# Patient Record
Sex: Female | Born: 1990 | Race: Black or African American | Hispanic: No | Marital: Single | Smoking: Current every day smoker
Health system: Southern US, Community
[De-identification: ages and names within clinical notes are randomized; demographics above are authoritative.]

---

## 2019-05-13 ENCOUNTER — Ambulatory Visit (LOCAL_COMMUNITY_HEALTH_CENTER): Payer: Medicaid Other

## 2019-05-13 ENCOUNTER — Other Ambulatory Visit: Payer: Self-pay

## 2019-05-13 VITALS — BP 112/77 | Ht 64.0 in | Wt 202.5 lb

## 2019-05-13 DIAGNOSIS — Z3202 Encounter for pregnancy test, result negative: Secondary | ICD-10-CM | POA: Diagnosis not present

## 2019-05-13 LAB — PREGNANCY, URINE: Preg Test, Ur: NEGATIVE

## 2019-05-13 MED ORDER — MULTI-VITAMIN/MINERALS PO TABS: 1.0000 | ORAL_TABLET | Freq: Every day | ORAL | 0 refills | Status: AC

## 2019-05-13 NOTE — Progress Notes (Signed)
Recently moved to [facility] from [place]. Does not yet have PCP or pharmacy chosen. Currently taking no medicines. Folic acid counseling completed and MVI dispensed. Rich Number, RN

## 2019-09-25 ENCOUNTER — Other Ambulatory Visit: Payer: Self-pay

## 2019-09-25 ENCOUNTER — Emergency Department: Payer: Medicaid Other

## 2019-09-25 ENCOUNTER — Encounter: Payer: Self-pay | Admitting: Emergency Medicine

## 2019-09-25 ENCOUNTER — Emergency Department
Admission: EM | Admit: 2019-09-25 | Discharge: 2019-09-25 | Disposition: A | Discharge status: Home / Self Care | Payer: Medicaid Other | Attending: Emergency Medicine | Admitting: Emergency Medicine

## 2019-09-25 DIAGNOSIS — M545 Low back pain, unspecified: Secondary | ICD-10-CM

## 2019-09-25 DIAGNOSIS — M62830 Muscle spasm of back: Secondary | ICD-10-CM | POA: Insufficient documentation

## 2019-09-25 DIAGNOSIS — Z79899 Other long term (current) drug therapy: Secondary | ICD-10-CM | POA: Diagnosis not present

## 2019-09-25 LAB — URINALYSIS, COMPLETE (UACMP) WITH MICROSCOPIC
Bilirubin Urine: NEGATIVE
Glucose, UA: NEGATIVE mg/dL
Hgb urine dipstick: NEGATIVE
Ketones, ur: NEGATIVE mg/dL
Leukocytes,Ua: NEGATIVE
Nitrite: NEGATIVE
Protein, ur: NEGATIVE mg/dL
Specific Gravity, Urine: 1.027 (ref 1.005–1.030)
pH: 5 (ref 5.0–8.0)

## 2019-09-25 LAB — POCT PREGNANCY, URINE: Preg Test, Ur: NEGATIVE

## 2019-09-25 MED ORDER — CYCLOBENZAPRINE HCL 5 MG PO TABS: 5.0000 mg | ORAL_TABLET | Freq: Three times a day (TID) | ORAL | 0 refills | Status: DC | PRN

## 2019-09-25 MED ORDER — NAPROXEN 500 MG PO TABS
500.0000 mg | ORAL_TABLET | Freq: Once | ORAL | Status: AC
  Administered 2019-09-25: 13:00:00 500 mg via ORAL
  Filled 2019-09-25: qty 1

## 2019-09-25 MED ORDER — KETOROLAC TROMETHAMINE 10 MG PO TABS: 10.0000 mg | ORAL_TABLET | Freq: Once | ORAL | Status: DC

## 2019-09-25 MED ORDER — NAPROXEN 500 MG PO TABS: 500.0000 mg | ORAL_TABLET | Freq: Two times a day (BID) | ORAL | 0 refills | Status: AC

## 2019-09-25 MED ORDER — KETOROLAC TROMETHAMINE 30 MG/ML IJ SOLN
30.0000 mg | Freq: Once | INTRAMUSCULAR | Status: DC
  Filled 2019-09-25: qty 1

## 2019-09-25 NOTE — Discharge Instructions (Signed)
Your exam and XR are normal following your injury. Take the prescription anti-inflammatory and muscle relaxant as needed. Apply ice and/or moist heat to reduce symptoms. Follow-up with [facility] or return to the ED as needed.

## 2019-09-25 NOTE — ED Provider Notes (Signed)
[facility] Emergency Department Provider Note ____________________________________________  Time seen: 1112  I have reviewed the triage vital signs and the nursing notes.  HISTORY  Chief Complaint  Back Pain  HPI Emily Mcmillan is a 29 y.o. female presents herself to the ED for evaluation of a 2 to 3-day complaint of low back pain.   Patient reports she had to climb into her apartment to retreive the car keys. She then climbed over the balcony, and jumped to the ground from a hanging position. She admits to initially landing on her feet, but rolled onto her hands & feet. She describes a shock going through her back as she landed. She reports intermittent back spasms and pain increased with bending and prolonged sitting.  She also denies any bladder bowel incontinence, foot drop, or distal paresthesias. She reports pain is increased when she is bending over.  She has taken ibuprofen without lasting benefit. She denies any history of chronic or ongoing back pain.  History reviewed. No pertinent past medical history.  There are no problems to display for this patient.   History reviewed. No pertinent surgical history.  Prior to Admission medications   Medication Sig Start Date End Date Taking? Authorizing Provider  Multiple Vitamins-Minerals (MULTIVITAMIN WITH MINERALS) tablet Take 1 tablet by mouth daily. 05/13/19   Caren Macadam, MD    Allergies Patient has no known allergies.  History reviewed. No pertinent family history.  Social History Social History   Tobacco Use  . Smoking status: Not on file  Substance Use Topics  . Alcohol use: Not on file  . Drug use: Not on file    Review of Systems  Constitutional: Negative for fever. Cardiovascular: Negative for chest pain. Respiratory: Negative for shortness of breath. Gastrointestinal: Negative for abdominal pain, vomiting and diarrhea. Genitourinary: Negative for dysuria. Musculoskeletal:  Positive for back pain. Skin: Negative for rash. Neurological: Negative for headaches, focal weakness or numbness. ____________________________________________  PHYSICAL EXAM:  VITAL SIGNS: ED Triage Vitals  Enc Vitals Group     BP 09/25/19 1030 124/72     Pulse Rate 09/25/19 1030 83     Resp 09/25/19 1030 16     Temp 09/25/19 1030 98.6 F (37 C)     Temp Source 09/25/19 1030 Oral     SpO2 09/25/19 1030 98 %     Weight 09/25/19 1031 198 lb (89.8 kg)     Height 09/25/19 1031 5\' 3"  (1.6 m)     Head Circumference --      Peak Flow --      Pain Score 09/25/19 1031 0     Pain Loc --      Pain Edu? --      Excl. in [place]? --     Constitutional: Alert and oriented. Well appearing and in no distress. Head: Normocephalic and atraumatic. Eyes: Conjunctivae are normal. Normal extraocular movements Neck: Supple. No thyromegaly. Cardiovascular: Normal rate, regular rhythm. Normal distal pulses. Respiratory: Normal respiratory effort. No wheezes/rales/rhonchi. Gastrointestinal: Soft and nontender. No distention. Musculoskeletal: Normal spinal alignment. No midline tenderness, step-off, or deformity. Normal hip/knee flexion range bilaterally. Nontender with normal range of motion in all extremities.  Neurologic: CN II-XII grossly intact. Normal LE DTRs bilaterally.  Normal gait without ataxia. Negative seated SLR bilaterally. Normal speech and language. No gross focal neurologic deficits are appreciated. Skin:  Skin is warm, dry and intact. No rash noted. Psychiatric: Mood and affect are normal. Patient exhibits appropriate insight and judgment. ____________________________________________  LABS (pertinent positives/negatives Labs Reviewed  URINALYSIS, COMPLETE (UACMP) WITH MICROSCOPIC  POC URINE PREG, ED  ____________________________________________   RADIOLOGY  DG Lumbar Spine  Negative  I, Lamonda Noxon V Bacon-Zeda Gangwer, personally viewed and evaluated these images (plain radiographs)  as part of my medical decision making, as well as reviewing the written report by the radiologist. ____________________________________________  PROCEDURES  Naproxen 500 mg PO Procedures ____________________________________________  INITIAL IMPRESSION / ASSESSMENT AND PLAN / ED COURSE  DDX: lumbar strain, compression fracture, HNP, myalgias  Patient with ED evaluation injury to the low back.  Her exam is consistent with a lumbar strain and muscle spasm.  X-ray is negative and reassuring at this time for any acute fracture or dislocation.  Patient does have some straightening of the normal lordosis on films.  Her exam does not indicate any acute neuromuscular deficit.  She will be treated with anti-inflammatories and muscle relaxants at this time.  Return precautions have been reviewed.  Emily Mcmillan was evaluated in Emergency Department on 09/25/2019 for the symptoms described in the history of present illness. She was evaluated in the context of the global COVID-19 pandemic, which necessitated consideration that the patient might be at risk for infection with the SARS-CoV-2 virus that causes COVID-19. Institutional protocols and algorithms that pertain to the evaluation of patients at risk for COVID-19 are in a state of rapid change based on information released by regulatory bodies including the CDC and federal and state organizations. These policies and algorithms were followed during the patient's care in the ED. ____________________________________________  FINAL CLINICAL IMPRESSION(S) / ED DIAGNOSES  Final diagnoses:  Acute bilateral low back pain without sciatica  Muscle spasm of back      Lissa Hoard, PA-C 09/25/19 1409    Concha Se, MD 09/26/19 [id number]

## 2019-09-25 NOTE — ED Triage Notes (Signed)
Pt to ED via POV c/o back pain x 2 days. Pt denies numbness or weakness in her legs, Pt denies trouble holding her bowels or bladder. Pt is in NAD.

## 2019-09-25 NOTE — ED Notes (Signed)
Patient declined the IM injection and states she would prefer an oral form of Toradol.

## 2020-05-10 ENCOUNTER — Emergency Department
Admission: EM | Admit: 2020-05-10 | Discharge: 2020-05-10 | Disposition: A | Discharge status: Home / Self Care | Payer: Medicaid Other | Attending: Emergency Medicine | Admitting: Emergency Medicine

## 2020-05-10 ENCOUNTER — Encounter: Payer: Self-pay | Admitting: Emergency Medicine

## 2020-05-10 ENCOUNTER — Other Ambulatory Visit: Payer: Self-pay

## 2020-05-10 DIAGNOSIS — H65111 Acute and subacute allergic otitis media (mucoid) (sanguinous) (serous), right ear: Secondary | ICD-10-CM | POA: Diagnosis not present

## 2020-05-10 DIAGNOSIS — J069 Acute upper respiratory infection, unspecified: Secondary | ICD-10-CM | POA: Insufficient documentation

## 2020-05-10 DIAGNOSIS — F17200 Nicotine dependence, unspecified, uncomplicated: Secondary | ICD-10-CM | POA: Insufficient documentation

## 2020-05-10 DIAGNOSIS — R0981 Nasal congestion: Secondary | ICD-10-CM | POA: Diagnosis present

## 2020-05-10 MED ORDER — FLUTICASONE PROPIONATE 50 MCG/ACT NA SUSP: 1.0000 | Freq: Two times a day (BID) | NASAL | 0 refills | Status: DC

## 2020-05-10 MED ORDER — AMOXICILLIN 875 MG PO TABS: 875.0000 mg | ORAL_TABLET | Freq: Two times a day (BID) | ORAL | 0 refills | Status: DC

## 2020-05-10 MED ORDER — AMOXICILLIN 500 MG PO CAPS
1000.0000 mg | ORAL_CAPSULE | Freq: Once | ORAL | Status: AC
  Administered 2020-05-10: 1000 mg via ORAL
  Filled 2020-05-10: qty 2

## 2020-05-10 NOTE — ED Provider Notes (Signed)
[facility] Emergency Department Provider Note  ____________________________________________  Time seen: Approximately 9:34 PM  I have reviewed the triage vital signs and the nursing notes.   HISTORY  Chief Complaint No chief complaint on file.    HPI Emily Mcmillan is a 29 y.o. female who presents the emergency department complaining of upper respiratory infection symptoms of nasal congestion, headache, sore throat, cough.  Patient states that she has had a negative Covid test for the symptoms.  Patient states that today she developed sharp right ear pain and believes that she may have an ear infection.  No chest pain or shortness of breath.  No abdominal pain no nausea or vomiting.  Patient's main complaint at this time is right ear pain         History reviewed. No pertinent past medical history.  There are no problems to display for this patient.   History reviewed. No pertinent surgical history.  Prior to Admission medications   Medication Sig Start Date End Date Taking? Authorizing Provider  amoxicillin (AMOXIL) 875 MG tablet Take 1 tablet (875 mg total) by mouth 2 (two) times daily. 05/10/20   Marcelline Temkin, Delorise Royals, PA-C  cyclobenzaprine (FLEXERIL) 5 MG tablet Take 1 tablet (5 mg total) by mouth 3 (three) times daily as needed. 09/25/19   Menshew, Charlesetta Ivory, PA-C  fluticasone (FLONASE) 50 MCG/ACT nasal spray Place 1 spray into both nostrils 2 (two) times daily. 05/10/20   Berea Majkowski, Delorise Royals, PA-C  Multiple Vitamins-Minerals (MULTIVITAMIN WITH MINERALS) tablet Take 1 tablet by mouth daily. 05/13/19   Federico Flake, MD    Allergies Patient has no known allergies.  No family history on file.  Social History Social History   Tobacco Use  . Smoking status: Current Every Day Smoker  . Smokeless tobacco: Never Used  Vaping Use  . Vaping Use: Never used  Substance Use Topics  . Alcohol use: Not on file  . Drug use: Not on file      Review of Systems  Constitutional: Positive fever/chills Eyes: No visual changes. No discharge ENT: Right ear pain, nasal congestion, sore throat Cardiovascular: no chest pain. Respiratory: Positive cough. No SOB. Gastrointestinal: No abdominal pain.  No nausea, no vomiting.  No diarrhea.  No constipation. Musculoskeletal: Negative for musculoskeletal pain. Skin: Negative for rash, abrasions, lacerations, ecchymosis. Neurological: Negative for headaches, focal weakness or numbness. 10-point ROS otherwise negative.  ____________________________________________   PHYSICAL EXAM:  VITAL SIGNS: ED Triage Vitals  Enc Vitals Group     BP 05/10/20 1920 128/87     Pulse Rate 05/10/20 1920 88     Resp 05/10/20 1920 18     Temp 05/10/20 1920 98.1 F (36.7 C)     Temp Source 05/10/20 1920 Oral     SpO2 05/10/20 1920 100 %     Weight 05/10/20 1921 197 lb (89.4 kg)     Height 05/10/20 1921 5\' 4"  (1.626 m)     Head Circumference --      Peak Flow --      Pain Score 05/10/20 1920 5     Pain Loc --      Pain Edu? --      Excl. in [place]? --      Constitutional: Alert and oriented. Well appearing and in no acute distress. Eyes: Conjunctivae are normal. PERRL. EOMI. Head: Atraumatic. ENT:      Ears: EAC and TM on left side is unremarkable.  EAC on right side is  unremarkable.  TM is severely bulging but no perforation currently.  Significant mucoid fluid behind TM.      Nose: No congestion/rhinnorhea.      Mouth/Throat: Mucous membranes are moist.  Neck: No stridor.   Hematological/Lymphatic/Immunilogical: No cervical lymphadenopathy. Cardiovascular: Normal rate, regular rhythm. Normal S1 and S2.  Good peripheral circulation. Respiratory: Normal respiratory effort without tachypnea or retractions. Lungs CTAB. Good air entry to the bases with no decreased or absent breath sounds Musculoskeletal: Full range of motion to all extremities. No gross deformities appreciated. Neurologic:   Normal speech and language. No gross focal neurologic deficits are appreciated.  Skin:  Skin is warm, dry and intact. No rash noted. Psychiatric: Mood and affect are normal. Speech and behavior are normal. Patient exhibits appropriate insight and judgement.   ____________________________________________   LABS (all labs ordered are listed, but only abnormal results are displayed)  Labs Reviewed - No data to display ____________________________________________  EKG   ____________________________________________  RADIOLOGY   No results found.  ____________________________________________    PROCEDURES  Procedure(s) performed:    Procedures    Medications  amoxicillin (AMOXIL) capsule 1,000 mg (has no administration in time range)     ____________________________________________   INITIAL IMPRESSION / ASSESSMENT AND PLAN / ED COURSE  Pertinent labs & imaging results that were available during my care of the patient were reviewed by me and considered in my medical decision making (see chart for details).  Review of the [place] CSRS was performed in accordance of the [facility] prior to dispensing any controlled drugs.           Patient's diagnosis is consistent with viral URI, otitis media.  Patient presents emergency department complaining of cold like symptoms times several days.  Patient began with sharp right ear pain today.  She is already had a negative Covid test.  Patient's primary concern was right ear pain.  Visualization revealed findings consistent with otitis media.  Patient had significant bulging of the TM but there has been no spontaneous perforation.  Patient will be started on antibiotics here in the emergency department.  Amoxicillin and Flonase prescribed for the patient outpatient.  Patient has sudden popping sensation, muffled hearing, drainage of the right ear she should follow-up with ENT for likely perforation at that time..Patient is given ED  precautions to return to the ED for any worsening or new symptoms.     ____________________________________________  FINAL CLINICAL IMPRESSION(S) / ED DIAGNOSES  Final diagnoses:  Acute mucoid otitis media of right ear  Viral URI      NEW MEDICATIONS STARTED DURING THIS VISIT:  ED Discharge Orders         Ordered    amoxicillin (AMOXIL) 875 MG tablet  2 times daily        05/10/20 2143    fluticasone (FLONASE) 50 MCG/ACT nasal spray  2 times daily        05/10/20 2143              This chart was dictated using voice recognition software/Dragon. Despite best efforts to proofread, errors can occur which can change the meaning. Any change was purely unintentional.    Lanette Hampshire 05/10/20 2143    Sharman Cheek, MD 05/10/20 2214

## 2020-05-10 NOTE — ED Triage Notes (Signed)
Patient ambulatory to triage with steady gait, without difficulty or distress noted; pt reports since Monday having congestion, cough, sore throat & rt earache

## 2021-01-24 ENCOUNTER — Encounter: Payer: Self-pay | Admitting: *Deleted

## 2021-01-24 ENCOUNTER — Emergency Department
Admission: EM | Admit: 2021-01-24 | Discharge: 2021-01-24 | Disposition: A | Discharge status: Home / Self Care | Payer: Medicaid Other | Attending: Emergency Medicine | Admitting: Emergency Medicine

## 2021-01-24 ENCOUNTER — Other Ambulatory Visit: Payer: Self-pay

## 2021-01-24 DIAGNOSIS — H9203 Otalgia, bilateral: Secondary | ICD-10-CM | POA: Diagnosis present

## 2021-01-24 DIAGNOSIS — H6983 Other specified disorders of Eustachian tube, bilateral: Secondary | ICD-10-CM

## 2021-01-24 DIAGNOSIS — H6993 Unspecified Eustachian tube disorder, bilateral: Secondary | ICD-10-CM | POA: Insufficient documentation

## 2021-01-24 DIAGNOSIS — F17210 Nicotine dependence, cigarettes, uncomplicated: Secondary | ICD-10-CM | POA: Diagnosis not present

## 2021-01-24 MED ORDER — FLUTICASONE PROPIONATE 50 MCG/ACT NA SUSP: 1.0000 | Freq: Two times a day (BID) | NASAL | 1 refills | Status: AC

## 2021-01-24 MED ORDER — CETIRIZINE HCL 10 MG PO TABS: 10.0000 mg | ORAL_TABLET | Freq: Every day | ORAL | 1 refills | Status: AC

## 2021-01-24 NOTE — ED Provider Notes (Signed)
[facility] Emergency Department Provider Note  ____________________________________________  Time seen: Approximately 8:07 PM  I have reviewed the triage vital signs and the nursing notes.   HISTORY  Chief Complaint Otalgia    HPI Emily Mcmillan is a 30 y.o. female who presents the emergency department bilateral ear fullness.  She states that she is just getting over a round of COVID, has had some nasal congestion and now ear pressure.  She states that she had had this once before and was diagnosed with eustachian tube dysfunction.  The Flonase and Zyrtec at that time had improved her symptoms.  She denies any pain or fever at this time and feels like she likely does not have an ear infection but wants to be evaluated for same.  No other concerning symptoms at this time.  No medications for this complaint prior to arrival.       No past medical history on file.  There are no problems to display for this patient.   No past surgical history on file.  Prior to Admission medications   Medication Sig Start Date End Date Taking? Authorizing Provider  cetirizine (ZYRTEC) 10 MG tablet Take 1 tablet (10 mg total) by mouth daily. 01/24/21  Yes Gracey Tolle, Delorise Royals, PA-C  fluticasone (FLONASE) 50 MCG/ACT nasal spray Place 1 spray into both nostrils 2 (two) times daily. 01/24/21  Yes Sharon Stapel, Delorise Royals, PA-C  Multiple Vitamins-Minerals (MULTIVITAMIN WITH MINERALS) tablet Take 1 tablet by mouth daily. 05/13/19   Federico Flake, MD    Allergies Patient has no known allergies.  No family history on file.  Social History Social History   Tobacco Use   Smoking status: Every Day    Pack years: 0.00   Smokeless tobacco: Never  Vaping Use   Vaping Use: Never used  Substance Use Topics   Alcohol use: Yes     Review of Systems  Constitutional: No fever/chills Eyes: No visual changes. No discharge ENT: Bilateral ear fullness Cardiovascular: no  chest pain. Respiratory: no cough. No SOB. Gastrointestinal: No abdominal pain.  No nausea, no vomiting.  No diarrhea.  No constipation. Musculoskeletal: Negative for musculoskeletal pain. Skin: Negative for rash, abrasions, lacerations, ecchymosis. Neurological: Negative for headaches, focal weakness or numbness.  10 System ROS otherwise negative.  ____________________________________________   PHYSICAL EXAM:  VITAL SIGNS: ED Triage Vitals  Enc Vitals Group     BP 01/24/21 1646 118/60     Pulse Rate 01/24/21 1646 77     Resp 01/24/21 1646 18     Temp 01/24/21 1646 98 F (36.7 C)     Temp Source 01/24/21 1646 Oral     SpO2 01/24/21 1646 98 %     Weight 01/24/21 1643 195 lb (88.5 kg)     Height 01/24/21 1643 5\' 4"  (1.626 m)     Head Circumference --      Peak Flow --      Pain Score 01/24/21 1643 0     Pain Loc --      Pain Edu? --      Excl. in [place]? --      Constitutional: Alert and oriented. Well appearing and in no acute distress. Eyes: Conjunctivae are normal. PERRL. EOMI. Head: Atraumatic. ENT:      Ears: EACs unremarkable bilaterally.  TMs are bulging bilaterally with no injection or air-fluid level.      Nose: No congestion/rhinnorhea.      Mouth/Throat: Mucous membranes are moist.  Neck: No  stridor.  Cardiovascular: Normal rate, regular rhythm. Normal S1 and S2.  Good peripheral circulation. Respiratory: Normal respiratory effort without tachypnea or retractions. Lungs CTAB. Good air entry to the bases with no decreased or absent breath sounds.  Some delayed Musculoskeletal: Full range of motion to all extremities. No gross deformities appreciated. Neurologic:  Normal speech and language. No gross focal neurologic deficits are appreciated.  Skin:  Skin is warm, dry and intact. No rash noted. Psychiatric: Mood and affect are normal. Speech and behavior are normal. Patient exhibits appropriate insight and  judgement.   ____________________________________________   LABS (all labs ordered are listed, but only abnormal results are displayed)  Labs Reviewed - No data to display ____________________________________________  EKG   ____________________________________________  RADIOLOGY   No results found.  ____________________________________________    PROCEDURES  Procedure(s) performed:    Procedures    Medications - No data to display   ____________________________________________   INITIAL IMPRESSION / ASSESSMENT AND PLAN / ED COURSE  Pertinent labs & imaging results that were available during my care of the patient were reviewed by me and considered in my medical decision making (see chart for details).  Review of the  CSRS was performed in accordance of the [facility] prior to dispensing any controlled drugs.           Patient's diagnosis is consistent with eustachian tube dysfunction bilaterally.  Patient presented to the emergency department with bilateral ear fullness.  She has a history eustachian tube dysfunction and has had recent COVID with nasal congestion.  Findings are consistent with eustachian tube dysfunction with no evidence of otitis media or otitis externa.  Patient will be placed on Flonase and Zyrtec.  Follow-up primary care as needed..  Patient is given ED precautions to return to the ED for any worsening or new symptoms.     ____________________________________________  FINAL CLINICAL IMPRESSION(S) / ED DIAGNOSES  Final diagnoses:  Eustachian tube dysfunction, bilateral      NEW MEDICATIONS STARTED DURING THIS VISIT:  ED Discharge Orders          Ordered    fluticasone (FLONASE) 50 MCG/ACT nasal spray  2 times daily        01/24/21 2008    cetirizine (ZYRTEC) 10 MG tablet  Daily        01/24/21 2009                This chart was dictated using voice recognition software/Dragon. Despite best efforts to proofread,  errors can occur which can change the meaning. Any change was purely unintentional.    Lanette Hampshire 01/24/21 2034    Phineas Semen, MD 01/24/21 2125

## 2021-01-24 NOTE — ED Notes (Signed)
See triage note  Presents with bilateral ear pain for couple of days   No fever

## 2021-01-24 NOTE — ED Triage Notes (Signed)
Pt has bil ear pain  sx for 2 weeks.  Taking tylenol without relief.  Pt alert.

## 2021-04-21 IMAGING — CR DG LUMBAR SPINE COMPLETE 4+V
5 series · 5 of 5 positions shown · non-contrast
Comparison: None.

CLINICAL DATA: Acute back pain status post jumping from balcony.

EXAM:
LUMBAR SPINE - COMPLETE 4+ VIEW

[l-spine ap]
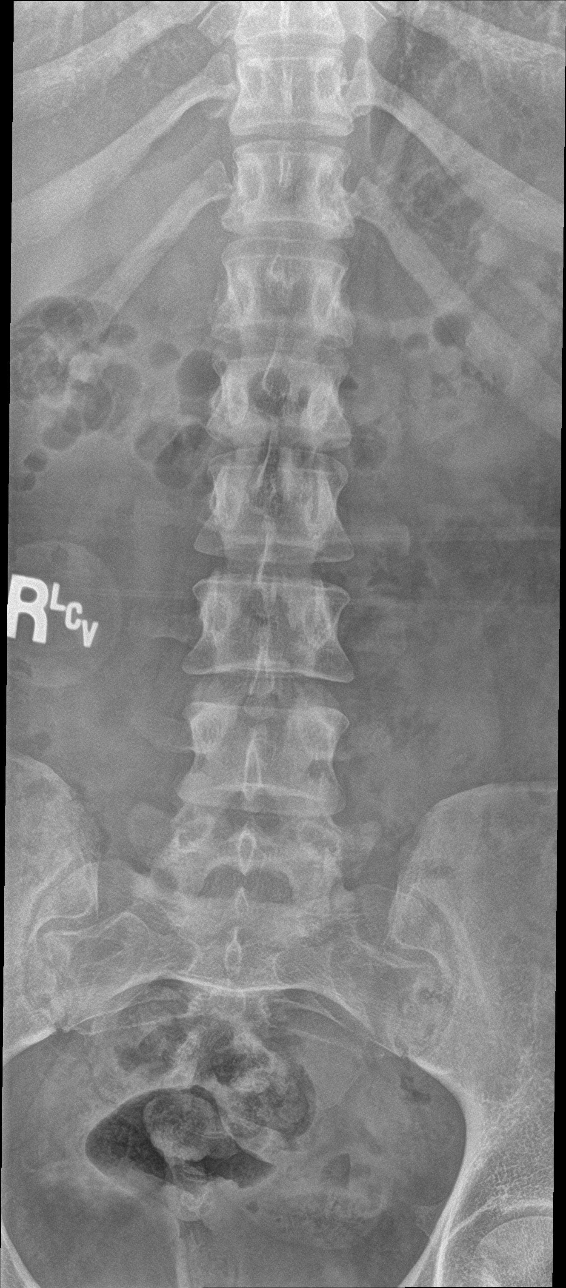

[l-spine obl (1 of 2)]
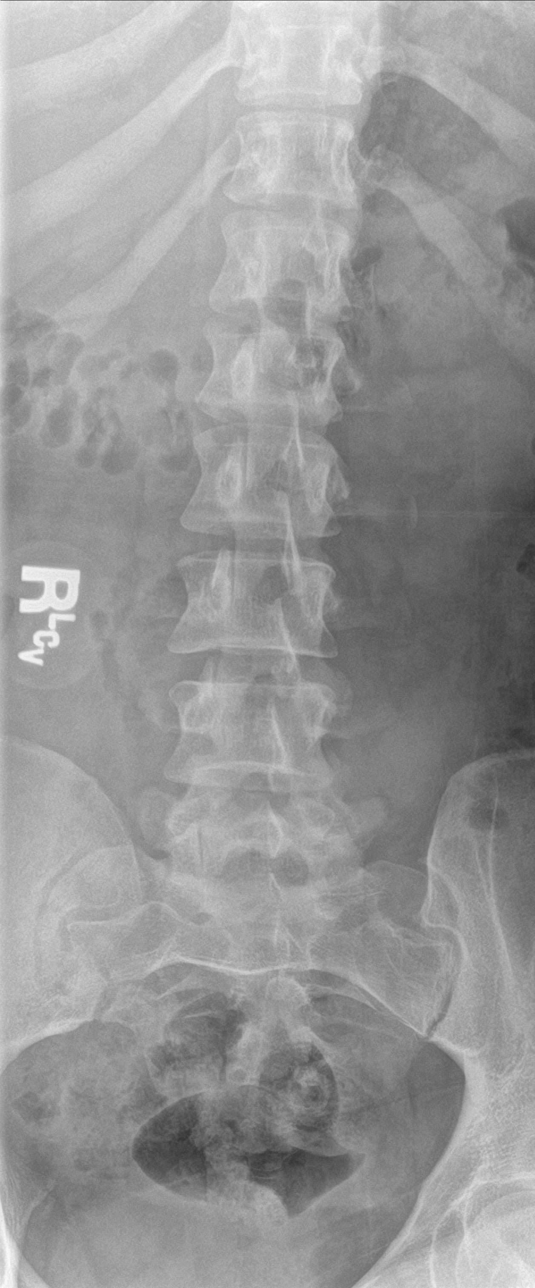

[l-spine obl (2 of 2)]
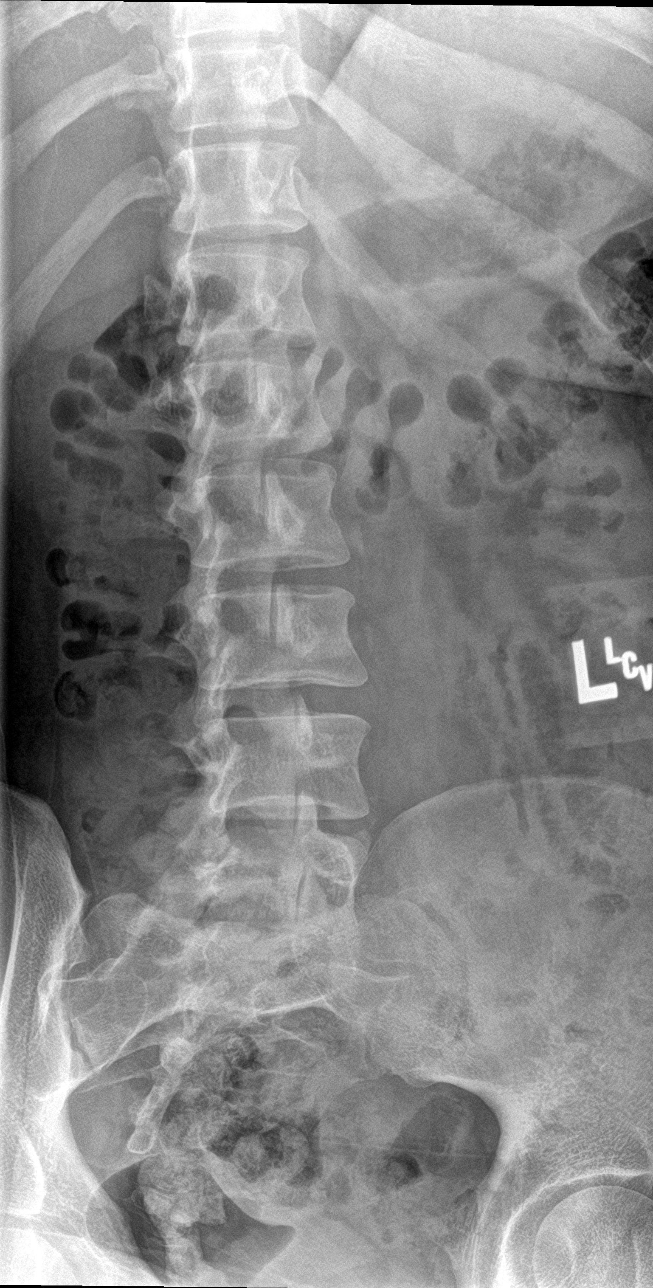

[l-spine lat]
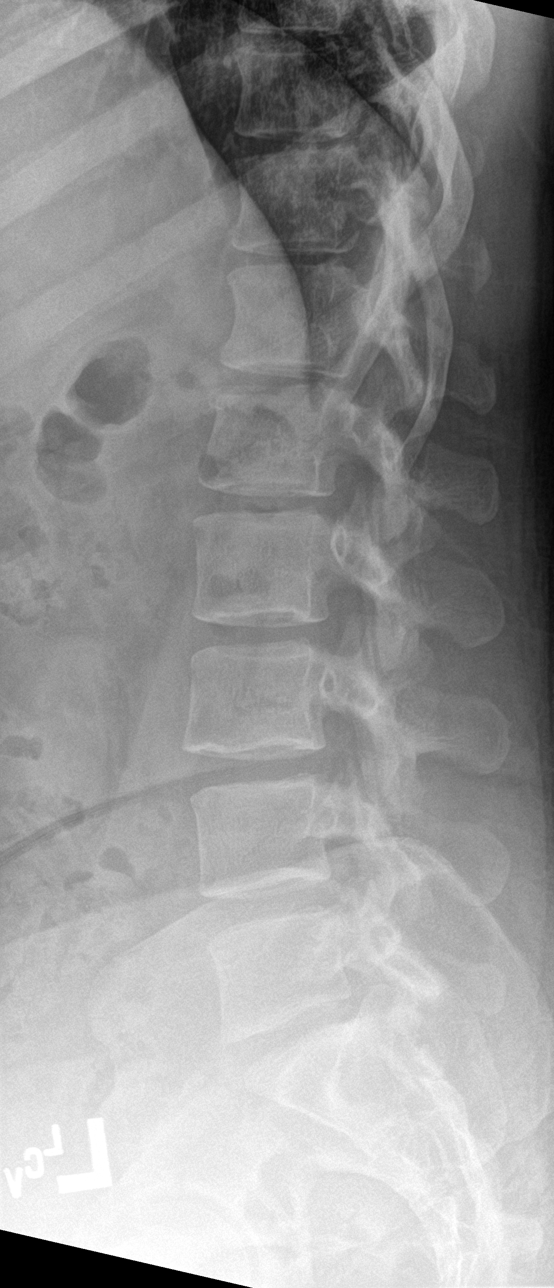

[l-spine spot]
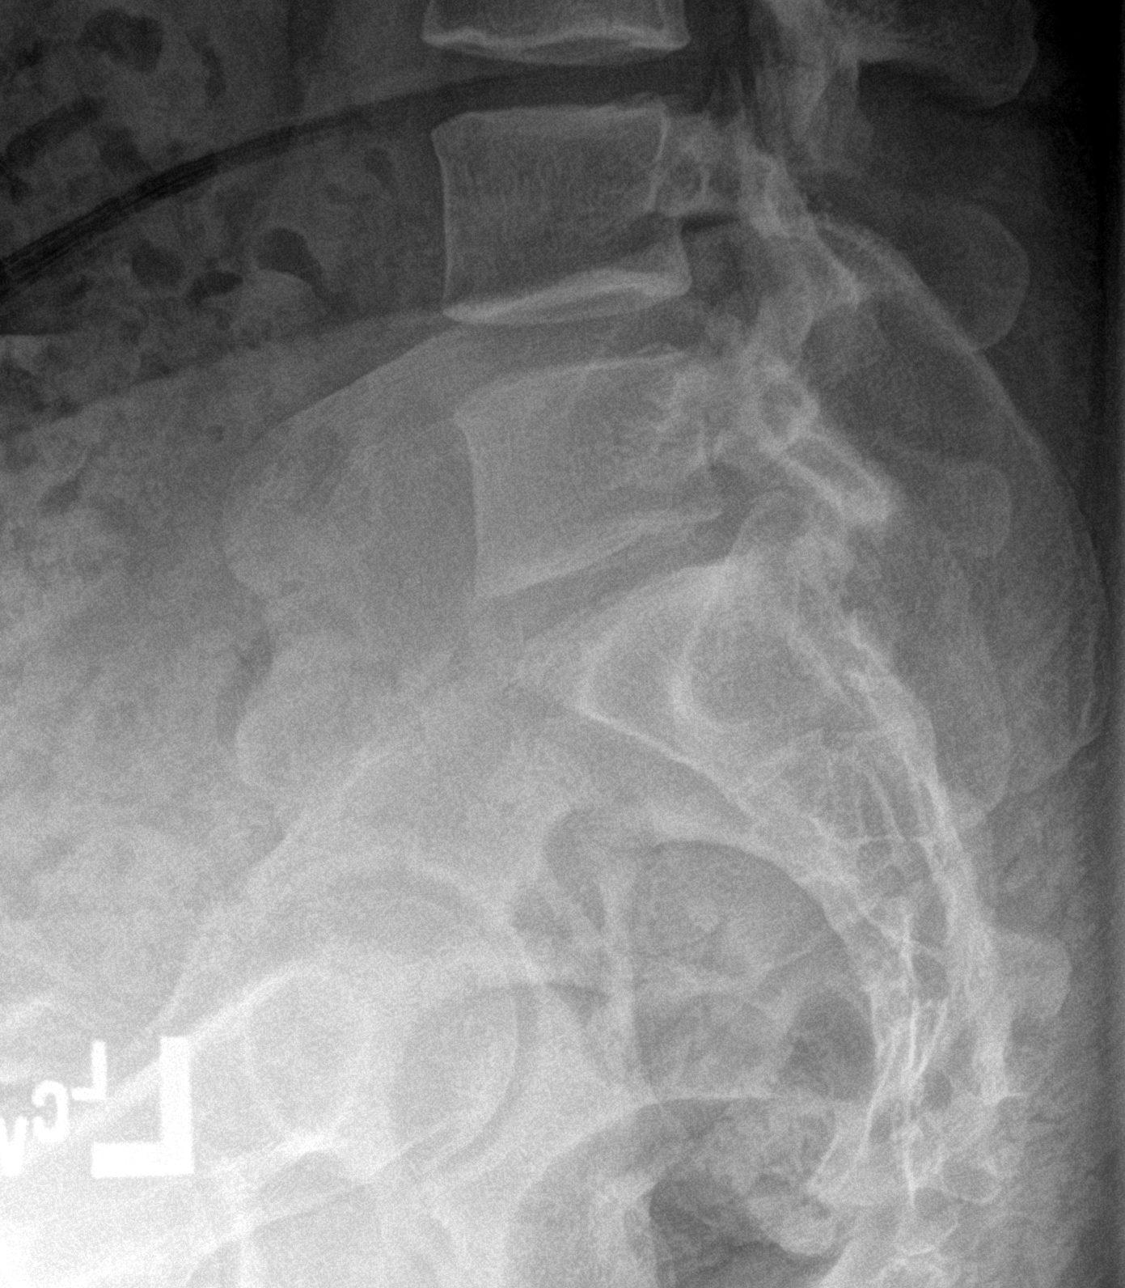

[5 of 5 positions shown; findings below may reference images not displayed]

FINDINGS: There is no evidence of lumbar spine fracture. Alignment is normal.
Intervertebral disc spaces are maintained.
IMPRESSION: Negative.

## 2022-08-16 ENCOUNTER — Other Ambulatory Visit: Payer: Self-pay

## 2022-08-16 ENCOUNTER — Emergency Department
Admission: EM | Admit: 2022-08-16 | Discharge: 2022-08-16 | Disposition: A | Discharge status: Home / Self Care | Payer: Medicaid Other | Attending: Emergency Medicine | Admitting: Emergency Medicine

## 2022-08-16 ENCOUNTER — Emergency Department: Payer: Medicaid Other

## 2022-08-16 DIAGNOSIS — M654 Radial styloid tenosynovitis [de Quervain]: Secondary | ICD-10-CM | POA: Insufficient documentation

## 2022-08-16 DIAGNOSIS — M25531 Pain in right wrist: Secondary | ICD-10-CM | POA: Diagnosis present

## 2022-08-16 DIAGNOSIS — X500XXA Overexertion from strenuous movement or load, initial encounter: Secondary | ICD-10-CM | POA: Insufficient documentation

## 2022-08-16 MED ORDER — NAPROXEN 500 MG PO TABS: 500.0000 mg | ORAL_TABLET | Freq: Two times a day (BID) | ORAL | 0 refills | Status: AC

## 2022-08-16 NOTE — Discharge Instructions (Addendum)
-  Please take the naproxen as prescribed.  You may additionally utilize the brace as needed for comfort.  -Avoid any strenuous activities for the hand.  -You may follow-up with the orthopedist as needed.  -Return to the emergency department anytime if you begin to experience any new or worsening symptoms.

## 2022-08-16 NOTE — ED Triage Notes (Signed)
Pt comes with c/o wrist pain. Pt states right wrist pain. Pt states she has been doing a lot of lifting and may have injured it. Pt states it started today.

## 2022-08-16 NOTE — ED Provider Notes (Signed)
[facility] Provider Note    None    (approximate)   History   Chief Complaint Wrist Pain   HPI Emily Mcmillan is a 32 y.o. female, no significant medical history, presents to the emergency department for evaluation of right wrist pain.  Patient states that she has been doing a lot of lifting at work over the past few weeks, lifting tables and chairs frequently.  She states that she has been having persistent pain along the radial aspect of her wrist/thumb.  Denies fever/chills, paresthesias, cold sensation, restless lesions, chest pain, shortness of breath, abdominal pain, or dizziness/lightheadedness.   History Limitations: No limitations.        Physical Exam  Triage Vital Signs: ED Triage Vitals  Enc Vitals Group     BP 08/16/22 1603 128/74     Pulse Rate 08/16/22 1603 67     Resp 08/16/22 1603 18     Temp 08/16/22 1603 98 F (36.7 C)     Temp src --      SpO2 08/16/22 1603 98 %     Weight --      Height --      Head Circumference --      Peak Flow --      Pain Score 08/16/22 1554 4     Pain Loc --      Pain Edu? --      Excl. in Max? --     Most recent vital signs: Vitals:   08/16/22 1603  BP: 128/74  Pulse: 67  Resp: 18  Temp: 98 F (36.7 C)  SpO2: 98%    General: Awake, NAD.  Skin: Warm, dry. No rashes or lesions.  Eyes: PERRL. Conjunctivae normal.  CV: Good peripheral perfusion.  Resp: Normal effort.  Abd: Soft, non-tender. No distention.  Neuro: At baseline. No gross neurological deficits.  Musculoskeletal: Normal ROM of all extremities.  Focused Exam: No gross deformities to the right upper extremity.  No point tenderness along the snuffbox region or other carpal bones.  No tenderness along the metacarpals.  She does appear to have pain/discomfort with Wynn Maudlin test.  Physical Exam    ED Results / Procedures / Treatments  Labs (all labs ordered are listed, but only abnormal results are displayed) Labs  Reviewed - No data to display   EKG N/A.    RADIOLOGY  ED Provider Interpretation: I personally reviewed and interpreted this x-ray, no evidence of acute osseous abnormalities.  DG Wrist Complete Right  Result Date: 08/16/2022 CLINICAL DATA:  Pain EXAM: RIGHT WRIST - COMPLETE 3+ VIEW COMPARISON:  None Available. FINDINGS: There is no evidence of fracture or dislocation. There is no evidence of arthropathy or other focal bone abnormality. Soft tissues are unremarkable. IMPRESSION: Negative. Electronically Signed   By: Ronney Asters M.D.   On: 08/16/2022 16:23    PROCEDURES:  Critical Care performed: N/A.  Procedures    MEDICATIONS ORDERED IN ED: Medications - No data to display   IMPRESSION / MDM / [facility] / ED COURSE  I reviewed the triage vital signs and the nursing notes.                              Differential diagnosis includes, but is not limited to, scaphoid fracture, carpal dislocation, sprain, de Quervain tenosynovitis, phalangeal fracture.  Assessment/Plan Presentation consistent with de Quervain's tenosynovitis.  No evidence of any infectious etiologies.  X-ray does not show any acute osseous abnormalities.  Provide her with a wrist brace, as well as a prescription for naproxen.  Recommend that she follow-up with orthopedist as needed.  She was amenable to this plan.  Will discharge.  Provided the patient with anticipatory guidance, return precautions, and educational material. Encouraged the patient to return to the emergency department at any time if they begin to experience any new or worsening symptoms. Patient expressed understanding and agreed with the plan.   Patient's presentation is most consistent with acute complicated illness / injury requiring diagnostic workup.       FINAL CLINICAL IMPRESSION(S) / ED DIAGNOSES   Final diagnoses:  De Quervain's tenosynovitis     Rx / DC Orders   ED Discharge Orders          Ordered     naproxen (NAPROSYN) 500 MG tablet  2 times daily with meals        08/16/22 1709             Note:  This document was prepared using Dragon voice recognition software and may include unintentional dictation errors.   Teodoro Spray, [place] 08/16/22 4401    Naaman Plummer, MD 08/18/22 1102
